# Patient Record
Sex: Male | Born: 1970 | Race: Black or African American | Hispanic: No | Marital: Single | Smoking: Current every day smoker
Health system: Southern US, Community
[De-identification: ages and names within clinical notes are randomized; demographics above are authoritative.]

## PROBLEM LIST (undated history)

## (undated) DIAGNOSIS — K635 Polyp of colon: Secondary | ICD-10-CM

## (undated) DIAGNOSIS — F4310 Post-traumatic stress disorder, unspecified: Secondary | ICD-10-CM

## (undated) HISTORY — PX: OTHER SURGICAL HISTORY: SHX169

---

## 2012-07-29 ENCOUNTER — Emergency Department (HOSPITAL_COMMUNITY)
Admission: EM | Admit: 2012-07-29 | Discharge: 2012-07-29 | Disposition: A | Discharge status: Home / Self Care | Payer: Self-pay | Attending: Emergency Medicine | Admitting: Emergency Medicine

## 2012-07-29 ENCOUNTER — Encounter (HOSPITAL_COMMUNITY): Payer: Self-pay | Admitting: Emergency Medicine

## 2012-07-29 ENCOUNTER — Emergency Department (HOSPITAL_COMMUNITY): Payer: Self-pay

## 2012-07-29 DIAGNOSIS — R609 Edema, unspecified: Secondary | ICD-10-CM | POA: Insufficient documentation

## 2012-07-29 DIAGNOSIS — Z860100 Personal history of colon polyps, unspecified: Secondary | ICD-10-CM | POA: Insufficient documentation

## 2012-07-29 DIAGNOSIS — R0602 Shortness of breath: Secondary | ICD-10-CM | POA: Insufficient documentation

## 2012-07-29 DIAGNOSIS — R42 Dizziness and giddiness: Secondary | ICD-10-CM | POA: Insufficient documentation

## 2012-07-29 DIAGNOSIS — R079 Chest pain, unspecified: Secondary | ICD-10-CM | POA: Insufficient documentation

## 2012-07-29 DIAGNOSIS — Z79899 Other long term (current) drug therapy: Secondary | ICD-10-CM | POA: Insufficient documentation

## 2012-07-29 DIAGNOSIS — R600 Localized edema: Secondary | ICD-10-CM

## 2012-07-29 DIAGNOSIS — F4310 Post-traumatic stress disorder, unspecified: Secondary | ICD-10-CM

## 2012-07-29 DIAGNOSIS — F411 Generalized anxiety disorder: Secondary | ICD-10-CM | POA: Insufficient documentation

## 2012-07-29 DIAGNOSIS — R269 Unspecified abnormalities of gait and mobility: Secondary | ICD-10-CM | POA: Insufficient documentation

## 2012-07-29 HISTORY — DX: Post-traumatic stress disorder, unspecified: F43.10

## 2012-07-29 HISTORY — DX: Polyp of colon: K63.5

## 2012-07-29 LAB — COMPREHENSIVE METABOLIC PANEL
ALT: 23 U/L (ref 0–53)
Alkaline Phosphatase: 59 U/L (ref 39–117)
BUN: 11 mg/dL (ref 6–23)
CO2: 25 mEq/L (ref 19–32)
GFR calc Af Amer: >90 mL/min (ref >90)
GFR calc non Af Amer: >90 mL/min (ref >90)
Glucose, Bld: 97 mg/dL (ref 70–99)
Potassium: 3.9 mEq/L (ref 3.5–5.1)
Sodium: 134 mEq/L — ABNORMAL LOW (ref 135–145)

## 2012-07-29 LAB — CBC WITH DIFFERENTIAL/PLATELET
Eosinophils Relative: 2 % (ref 0–5)
Hemoglobin: 14.3 g/dL (ref 13.0–17.0)
Lymphocytes Relative: 31 % (ref 12–46)
Lymphs Abs: 2.4 10*3/uL (ref 0.7–4.0)
MCV: 84.6 fL (ref 78.0–100.0)
Monocytes Relative: 9 % (ref 3–12)
Platelets: 239 10*3/uL (ref 150–400)
RBC: 5.26 MIL/uL (ref 4.22–5.81)
WBC: 7.6 10*3/uL (ref 4.0–10.5)

## 2012-07-29 MED ORDER — MORPHINE SULFATE 4 MG/ML IJ SOLN
5.0000 mg | Freq: Once | INTRAMUSCULAR | Status: AC
  Administered 2012-07-29: 5 mg via INTRAMUSCULAR
  Filled 2012-07-29: qty 2

## 2012-07-29 MED ORDER — MORPHINE SULFATE 10 MG/ML IJ SOLN: 10.0000 mg | Freq: Once | INTRAMUSCULAR | Status: DC

## 2012-07-29 NOTE — ED Notes (Signed)
Pt states that for months he has been dealing with peripheral edema in his lower legs and feet as well as his hands. Pt states "i cant even get my ring of my finger".
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
## 2012-07-29 NOTE — ED Notes (Signed)
Pt requesting more pain meds. Thom Chimes PA aware.

## 2012-07-29 NOTE — ED Notes (Signed)
Patient transported to X-ray 

## 2012-07-30 NOTE — ED Provider Notes (Signed)
History/physical exam/procedure(s) were performed by non-physician practitioner and as supervising physician I was immediately available for consultation/collaboration. I have reviewed all notes and am in agreement with care and plan.   Tonjua Rossetti S Cashmere Dingley, MD 07/30/12 1203 

## 2015-01-27 IMAGING — CR DG CHEST 2V
2 series · 2 of 2 positions shown · non-contrast
Comparison: None.

CLINICAL DATA: Chest pain.  Shortness of breath.

CHEST - 2 VIEW

[w chest lat]
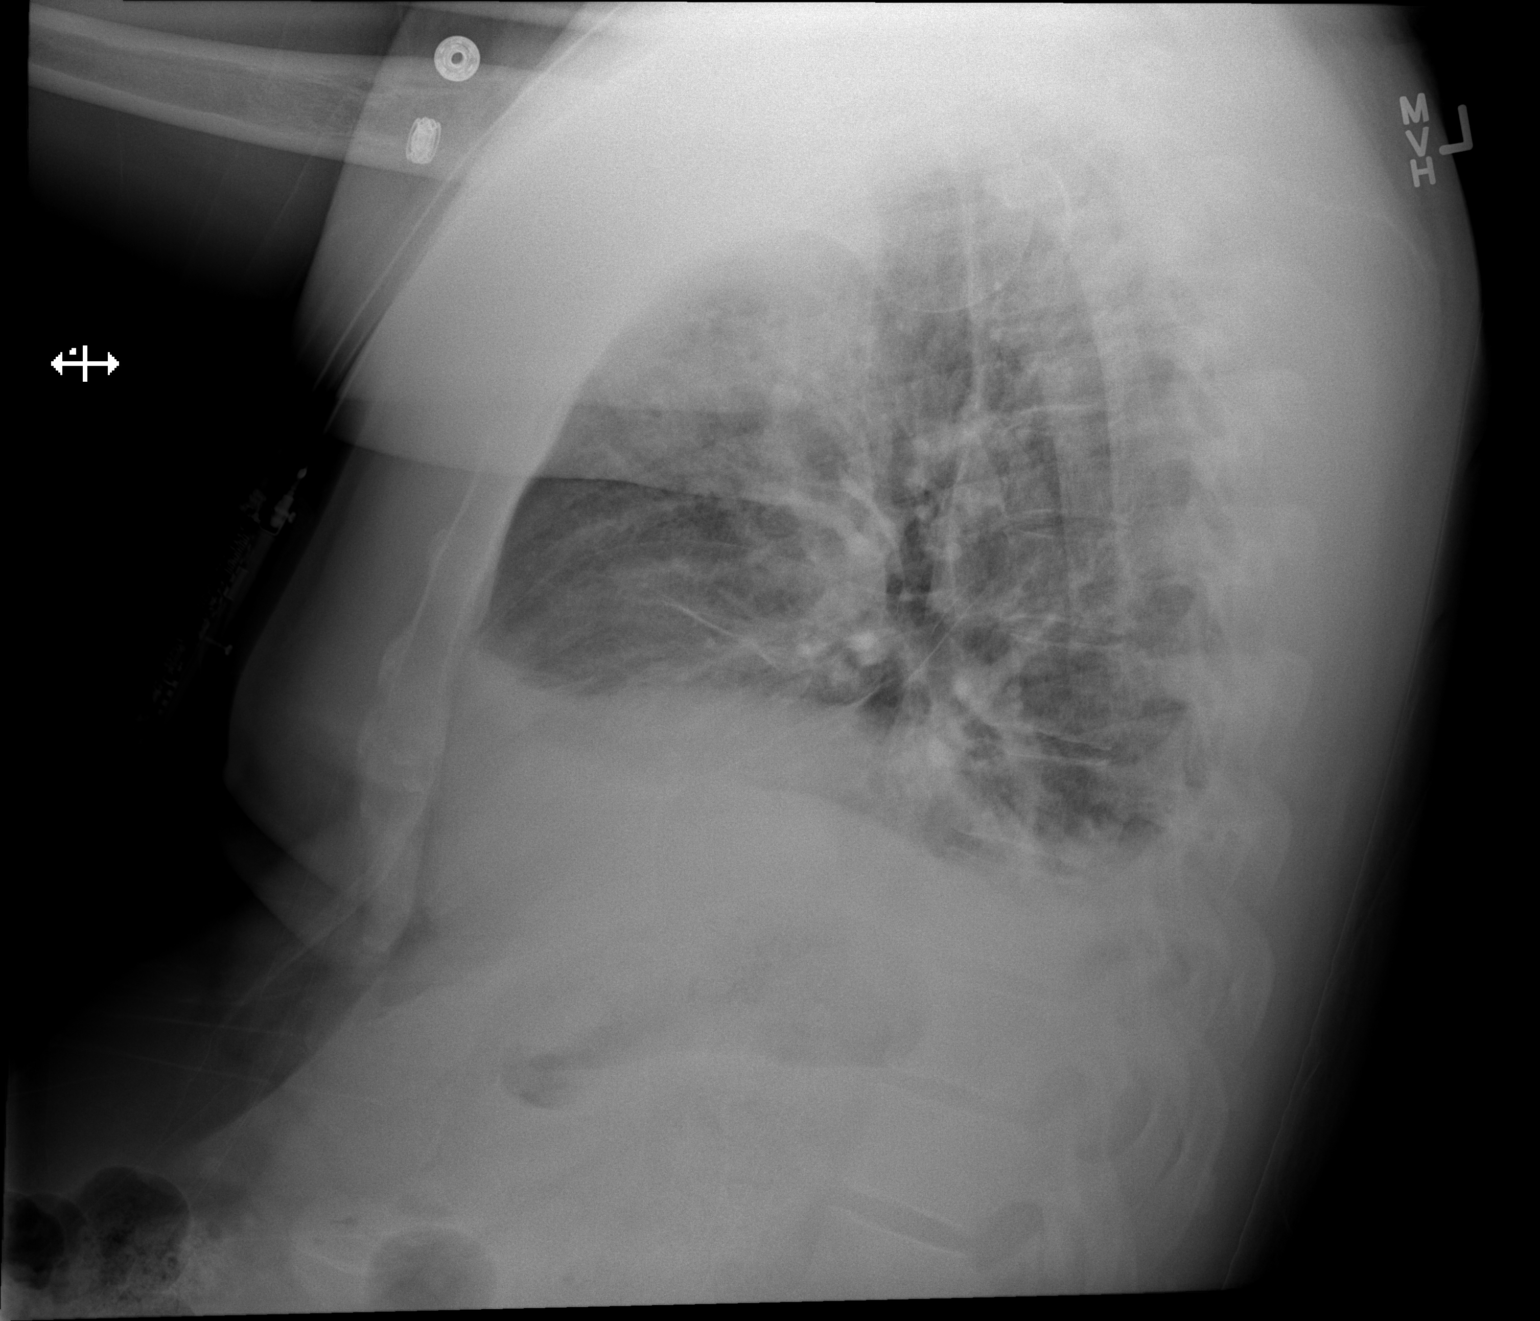

[x chest ap]
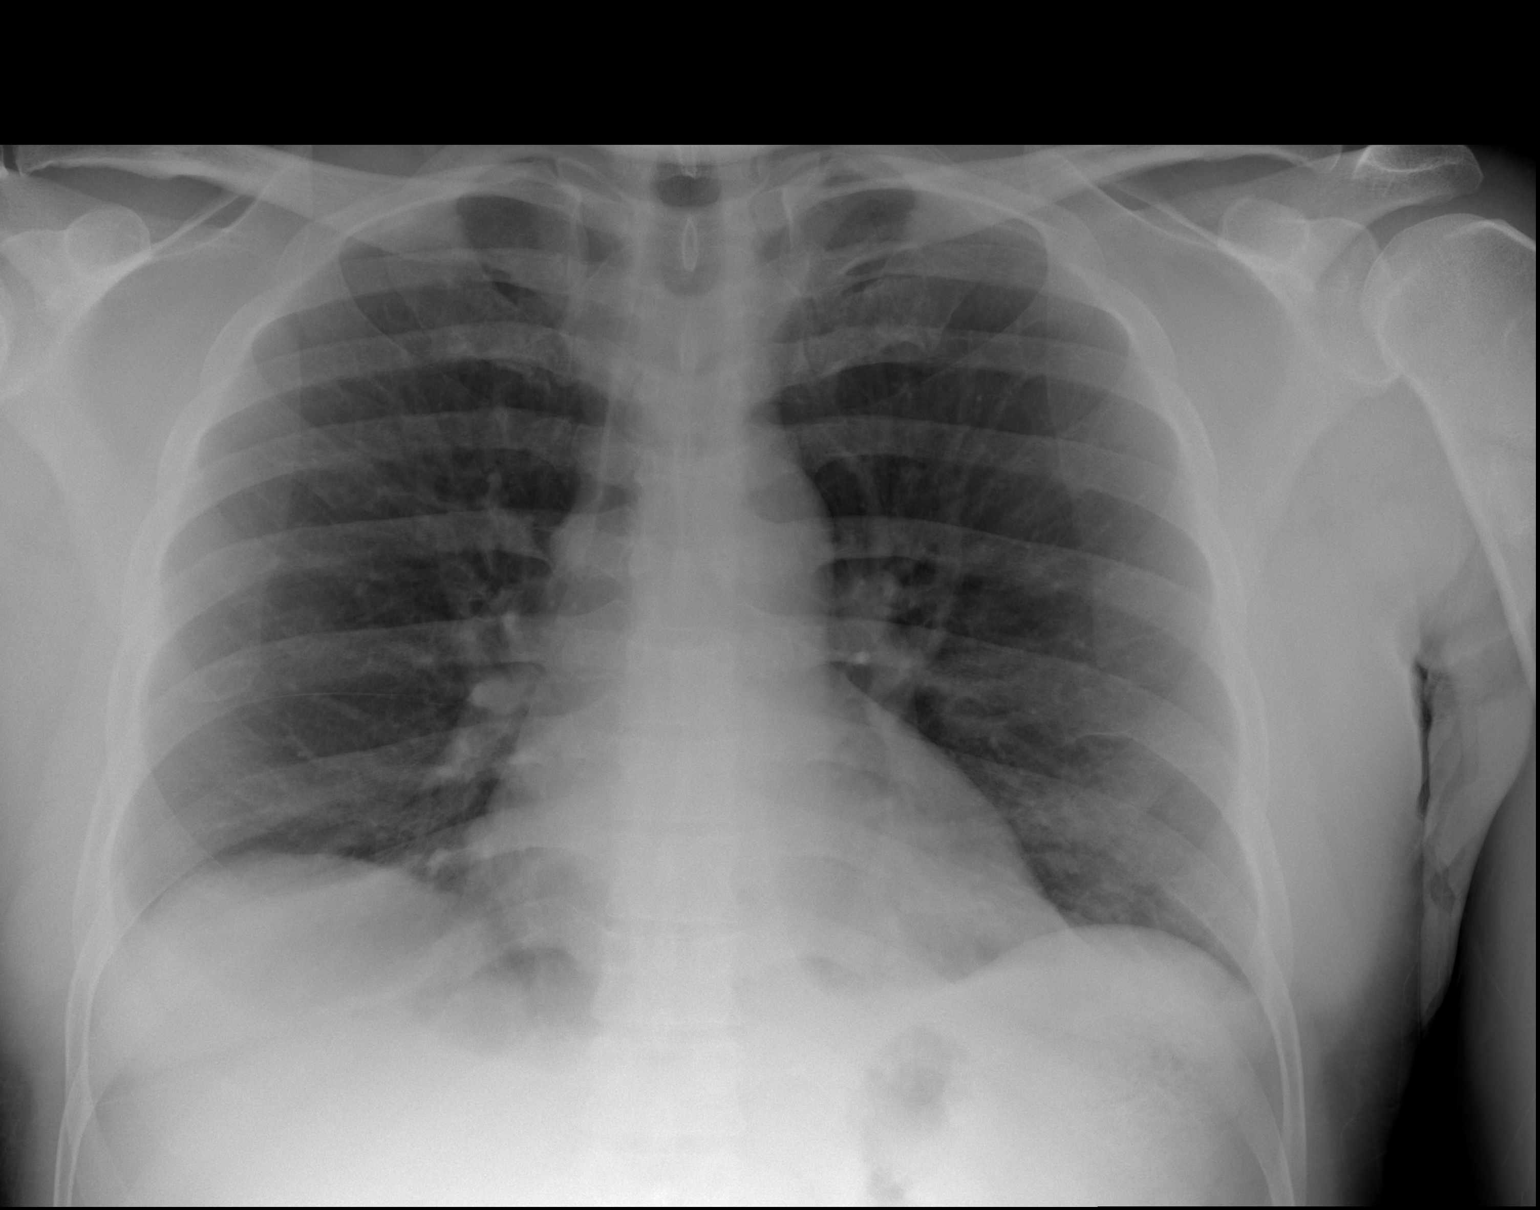

[2 of 2 positions shown; findings below may reference images not displayed]

FINDINGS: The heart, mediastinum and hilar contours are normal.
The lungs are clear except for some mild atelectatic changes at the
bases seen best on the lateral view.  There are no effusions or
pneumothoraces.  There are no acute bony abnormalities.
IMPRESSION: No active disease.

## 2017-10-08 ENCOUNTER — Emergency Department (HOSPITAL_COMMUNITY): Payer: Non-veteran care

## 2017-10-08 ENCOUNTER — Other Ambulatory Visit: Payer: Self-pay

## 2017-10-08 ENCOUNTER — Encounter (HOSPITAL_COMMUNITY): Payer: Self-pay | Admitting: Emergency Medicine

## 2017-10-08 ENCOUNTER — Emergency Department (HOSPITAL_COMMUNITY)
Admission: EM | Admit: 2017-10-08 | Discharge: 2017-10-09 | Disposition: A | Discharge status: Home / Self Care | Payer: Non-veteran care | Attending: Emergency Medicine | Admitting: Emergency Medicine

## 2017-10-08 DIAGNOSIS — F17210 Nicotine dependence, cigarettes, uncomplicated: Secondary | ICD-10-CM | POA: Diagnosis not present

## 2017-10-08 DIAGNOSIS — R0602 Shortness of breath: Secondary | ICD-10-CM | POA: Diagnosis present

## 2017-10-08 DIAGNOSIS — F4310 Post-traumatic stress disorder, unspecified: Secondary | ICD-10-CM | POA: Diagnosis not present

## 2017-10-08 DIAGNOSIS — G4700 Insomnia, unspecified: Secondary | ICD-10-CM | POA: Insufficient documentation

## 2017-10-08 DIAGNOSIS — E876 Hypokalemia: Secondary | ICD-10-CM | POA: Diagnosis not present

## 2017-10-08 DIAGNOSIS — Z79899 Other long term (current) drug therapy: Secondary | ICD-10-CM | POA: Diagnosis not present

## 2017-10-08 NOTE — ED Triage Notes (Addendum)
Pt reports "difficulty breathing and feeling like he's going to die." Also endorses CP, back pain, weakness, insomnia for 2 days. States he feels like he's going to stop breathing any second. Appears in NAD. No tachypnea or accessory muscle usage. VSS and WNL. Flat affect for varied complaints spoken of.

## 2017-10-09 LAB — URINALYSIS, ROUTINE W REFLEX MICROSCOPIC
BACTERIA UA: NONE SEEN
Bilirubin Urine: NEGATIVE
Glucose, UA: NEGATIVE mg/dL
KETONES UR: 5 mg/dL — AB
Leukocytes, UA: NEGATIVE
Nitrite: NEGATIVE
Protein, ur: NEGATIVE mg/dL
Specific Gravity, Urine: 1.015 (ref 1.005–1.030)
pH: 6 (ref 5.0–8.0)

## 2017-10-09 LAB — RAPID URINE DRUG SCREEN, HOSP PERFORMED
Amphetamines: POSITIVE — AB
BENZODIAZEPINES: NOT DETECTED
Barbiturates: NOT DETECTED
COCAINE: NOT DETECTED
OPIATES: NOT DETECTED
Tetrahydrocannabinol: NOT DETECTED

## 2017-10-09 LAB — BASIC METABOLIC PANEL
ANION GAP: 13 (ref 5–15)
BUN: 6 mg/dL (ref 6–20)
CHLORIDE: 96 mmol/L — AB (ref 98–111)
CO2: 29 mmol/L (ref 22–32)
Calcium: 9.8 mg/dL (ref 8.9–10.3)
Creatinine, Ser: 1.09 mg/dL (ref 0.61–1.24)
GFR calc Af Amer: >60 mL/min (ref >60)
GFR calc non Af Amer: >60 mL/min (ref >60)
GLUCOSE: 112 mg/dL — AB (ref 70–99)
POTASSIUM: 2.8 mmol/L — AB (ref 3.5–5.1)
Sodium: 138 mmol/L (ref 135–145)

## 2017-10-09 LAB — CBC
HEMATOCRIT: 55 % — AB (ref 39.0–52.0)
HEMOGLOBIN: 17.5 g/dL — AB (ref 13.0–17.0)
MCH: 28.2 pg (ref 26.0–34.0)
MCHC: 31.8 g/dL (ref 30.0–36.0)
MCV: 88.7 fL (ref 78.0–100.0)
Platelets: 288 10*3/uL (ref 150–400)
RBC: 6.2 MIL/uL — ABNORMAL HIGH (ref 4.22–5.81)
RDW: 11.7 % (ref 11.5–15.5)
WBC: 9.5 10*3/uL (ref 4.0–10.5)

## 2017-10-09 LAB — I-STAT TROPONIN, ED: Troponin i, poc: 0 ng/mL (ref 0.00–0.08)

## 2017-10-09 LAB — ETHANOL: Alcohol, Ethyl (B): <10 mg/dL (ref <10)

## 2017-10-09 LAB — MAGNESIUM: Magnesium: 1.9 mg/dL (ref 1.7–2.4)

## 2017-10-09 MED ORDER — LORAZEPAM 1 MG PO TABS
1.0000 mg | ORAL_TABLET | Freq: Once | ORAL | Status: AC
  Administered 2017-10-09: 1 mg via ORAL
  Filled 2017-10-09: qty 1

## 2017-10-09 MED ORDER — POTASSIUM CHLORIDE CRYS ER 20 MEQ PO TBCR: 20.0000 meq | EXTENDED_RELEASE_TABLET | Freq: Every day | ORAL | 0 refills | Status: AC

## 2017-10-09 MED ORDER — POTASSIUM CHLORIDE 10 MEQ/100ML IV SOLN
10.0000 meq | INTRAVENOUS | Status: AC
  Administered 2017-10-09 (×2): 10 meq via INTRAVENOUS
  Filled 2017-10-09 (×2): qty 100

## 2017-10-09 MED ORDER — SODIUM CHLORIDE 0.9 % IV BOLUS
1000.0000 mL | Freq: Once | INTRAVENOUS | Status: AC
  Administered 2017-10-09: 1000 mL via INTRAVENOUS

## 2017-10-09 MED ORDER — MAGNESIUM SULFATE 2 GM/50ML IV SOLN
2.0000 g | Freq: Once | INTRAVENOUS | Status: AC
  Administered 2017-10-09: 2 g via INTRAVENOUS
  Filled 2017-10-09: qty 50

## 2017-10-09 MED ORDER — POTASSIUM CHLORIDE CRYS ER 20 MEQ PO TBCR
40.0000 meq | EXTENDED_RELEASE_TABLET | Freq: Once | ORAL | Status: AC
  Administered 2017-10-09: 40 meq via ORAL
  Filled 2017-10-09: qty 2
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
## 2017-10-09 NOTE — ED Notes (Signed)
Patient ambulated to room with steady gait.

## 2017-10-09 NOTE — ED Notes (Signed)
Go to start patient's second run of potassium and patient states "I need to go home. I've already had one bag of that".   PA made aware.

## 2017-10-09 NOTE — ED Provider Notes (Signed)
[facility] EMERGENCY DEPARTMENT Provider Note   CSN: [id number] Arrival date & time: 10/08/17  2325     History   Chief Complaint Chief Complaint  Patient presents with  . Shortness of Breath  . Insomnia    HPI Micheal Barber is a 47 y.o. male with a hx of PTSD, COPD, sleep apnea presents to the Emergency Department complaining of gradual, persistent, progressively worsening SOB and dizziness onset this morning. Associated symptoms include inability to sleep (no sleep in 2 days), chest pain.  Pt reports when he closes his eyes, "my eyeballs bounce around like pinballs."  Pt reports he has never felt like this before.  Pt reports he feels like he is hyperventilating and he might die.  Pt reports he has no appetite and therefore not eaten in 2 days.  Pt reports he has been compliant with his antidepressants .  Nothing makes his symptoms better.  Pt reports symptoms are worsening with time.  Pt denies headache, neck pain, fever, chills, abd pain, N/V/D, weakness, syncope, dysuria.  Pt reports "my brain is on fire" and "I feel like I am going to become a zombie."    Pt reports smoking 1 ppd of cigarettes.  Pt denies marijuana or other drugs, vaping or EtOH usage.  Pt denies SI/HI, AVH.  Pt reports he has been drinking sweet tea and water today with polyuria and polydipsia.  No hx of DM.     The history is provided by the patient and medical records. No language interpreter was used.    Past Medical History:  Diagnosis Date  . Colon polyps   . PTSD (post-traumatic stress disorder)     There are no active problems to display for this patient.   Past Surgical History:  Procedure Laterality Date  . colon polyp removed          Home Medications    Prior to Admission medications   Medication Sig Start Date End Date Taking? Authorizing Provider  benztropine (COGENTIN) 1 MG tablet Take 0.5 mg by mouth 2 (two) times daily.    [provider]  clonazePAM  (KLONOPIN) 0.5 MG tablet Take 0.5 mg by mouth 2 (two) times daily.    [provider]  FLUoxetine (PROZAC) 20 MG capsule Take 20 mg by mouth every morning.    [provider]  hydrOXYzine (ATARAX/VISTARIL) 25 MG tablet Take 25 mg by mouth 2 (two) times daily as needed for anxiety.    [provider]  hyoscyamine (LEVSIN SL) 0.125 MG SL tablet Place 0.125 mg under the tongue every 4 (four) hours as needed for cramping.    [provider]  OLANZapine (ZYPREXA) 20 MG tablet Take 10-20 mg by mouth 2 (two) times daily. Take 0.5 tablet every morning and 1 tablet at bedtime.    [provider]  oxyCODONE (OXYCONTIN) 80 MG 12 hr tablet Take 80 mg by mouth every 12 (twelve) hours.    [provider]  potassium chloride SA (K-DUR,KLOR-CON) 20 MEQ tablet Take 1 tablet (20 mEq total) by mouth daily. 10/09/17   Deshawna Mcneece, Dahlia Client, PA-C  prazosin (MINIPRESS) 2 MG capsule Take 4 mg by mouth at bedtime.    [provider]  propranolol (INDERAL) 40 MG tablet Take 40 mg by mouth 2 (two) times daily.    [provider]  SUMAtriptan (IMITREX) 6 MG/0.5ML SOLN injection Inject 6 mg into the skin every 2 (two) hours as needed for migraine or headache.  [provider]    Family History History reviewed. No pertinent family history.  Social History Social History   Tobacco Use  . Smoking status: Current Every Day Smoker    Packs/day: 0.50    Years: 5.00    Pack years: 2.50    Types: Cigarettes  . Smokeless tobacco: Never Used  Substance Use Topics  . Alcohol use: No  . Drug use: No     Allergies   Tramadol and Trazodone and nefazodone   Review of Systems Review of Systems  Constitutional: Positive for appetite change. Negative for diaphoresis, fatigue, fever and unexpected weight change.  HENT: Negative for congestion and mouth sores.   Eyes: Negative for visual disturbance.  Respiratory: Positive for shortness of  breath. Negative for cough, chest tightness and wheezing.   Cardiovascular: Positive for chest pain.  Gastrointestinal: Negative for abdominal pain, constipation, diarrhea, nausea and vomiting.  Endocrine: Positive for polydipsia and polyuria. Negative for polyphagia.  Genitourinary: Negative for dysuria, frequency, hematuria and urgency.  Musculoskeletal: Negative for back pain and neck stiffness.  Skin: Negative for rash.  Allergic/Immunologic: Negative for immunocompromised state.  Neurological: Negative for syncope, light-headedness and headaches.  Hematological: Does not bruise/bleed easily.  Psychiatric/Behavioral: Positive for decreased concentration and sleep disturbance. The patient is nervous/anxious and has insomnia.      Physical Exam Updated Vital Signs BP 133/90 (BP Location: Right Arm)   Pulse 63   Temp 98.9 F (37.2 C) (Oral)   Resp 16   Ht 5\' 10"  (1.778 m)   Wt 99.8 kg   SpO2 97%   BMI 31.57 kg/m   Physical Exam  Constitutional: He appears well-developed and well-nourished. No distress.  Awake, alert, nontoxic appearance  HENT:  Head: Normocephalic and atraumatic.  Mouth/Throat: Oropharynx is clear and moist. No oropharyngeal exudate.  Eyes: Conjunctivae are normal. No scleral icterus.  Neck: Normal range of motion. Neck supple.  Cardiovascular: Normal rate, regular rhythm and intact distal pulses.  Pulmonary/Chest: Effort normal and breath sounds normal. No respiratory distress. He has no wheezes.  Equal chest expansion  Abdominal: Soft. Bowel sounds are normal. He exhibits no mass. There is no tenderness. There is no rebound and no guarding.  Musculoskeletal: Normal range of motion. He exhibits no edema.  Neurological: He is alert.  Speech is clear and goal oriented Moves extremities without ataxia  Skin: Skin is warm and dry. He is not diaphoretic.  Psychiatric: His mood appears anxious. His speech is rapid and/or pressured. He is not actively  hallucinating. He expresses no homicidal and no suicidal ideation. He expresses no suicidal plans and no homicidal plans.  Pt with wide eyes, difficulty focusing, rapid speech, very anxious  Nursing note and vitals reviewed.    ED Treatments / Results  Labs (all labs ordered are listed, but only abnormal results are displayed) Labs Reviewed  BASIC METABOLIC PANEL - Abnormal; Notable for the following components:      Result Value   Potassium 2.8 (*)    Chloride 96 (*)    Glucose, Bld 112 (*)    All other components within normal limits  CBC - Abnormal; Notable for the following components:   RBC 6.20 (*)    Hemoglobin 17.5 (*)    HCT 55.0 (*)    All other components within normal limits  RAPID URINE DRUG SCREEN, HOSP PERFORMED - Abnormal; Notable for the following components:   Amphetamines POSITIVE (*)    All other components within normal limits  URINALYSIS,  ROUTINE W REFLEX MICROSCOPIC - Abnormal; Notable for the following components:   Hgb urine dipstick SMALL (*)    Ketones, ur 5 (*)    All other components within normal limits  MAGNESIUM  ETHANOL  I-STAT TROPONIN, ED     Radiology Dg Chest 2 View  Result Date: 10/09/2017 CLINICAL DATA:  Chest pain and shortness of breath today. EXAM: CHEST - 2 VIEW COMPARISON:  07/29/2012 FINDINGS: Lung volumes are low. The cardiomediastinal contours are normal. The lungs are clear. Pulmonary vasculature is normal. No consolidation, pleural effusion, or pneumothorax. No acute osseous abnormalities are seen. IMPRESSION: Low lung volumes without acute chest finding. Electronically Signed   By: Narda Rutherford M.D.   On: 10/09/2017 00:12    Procedures Procedures (including critical care time)  Medications Ordered in ED Medications  LORazepam (ATIVAN) tablet 1 mg (has no administration in time range)  sodium chloride 0.9 % bolus 1,000 mL (0 mLs Intravenous Stopped 10/09/17 0403)  potassium chloride SA (K-DUR,KLOR-CON) CR tablet 40 mEq  (40 mEq Oral Given 10/09/17 0300)  potassium chloride 10 mEq in 100 mL IVPB (10 mEq Intravenous New Bag/Given 10/09/17 0523)  magnesium sulfate IVPB 2 g 50 mL (0 g Intravenous Stopped 10/09/17 0403)  sodium chloride 0.9 % bolus 1,000 mL (1,000 mLs Intravenous New Bag/Given 10/09/17 0521)     Initial Impression / Assessment and Plan / ED Course  I have reviewed the triage vital signs and the nursing notes.  Pertinent labs & imaging results that were available during my care of the patient were reviewed by me and considered in my medical decision making (see chart for details).  Clinical Course as of Oct 09 621  [facility] Oct 09, 2017  0443 Pt alert and oriented at this time.  He is no longer making strange statements and reports he is feeling better.  He has agreed to a Psyc assessment and potential medication recommendations.     [HM]  [phone] Discussed situation with patient's sister.  She reports she has never heard him talk like that or act this way.  She reports he looks much better at this time.  Long discussion with patient and sister.  I have recommended eval by TTS for medication recommendations.  They are both in agreement with him staying.   [HM]  0447 Pt is alert and oriented.  Denies SI/HI/AVH.  He is not currently a danger to himself or others.  Pt will stay voluntarily.  I do not believe pt requires IVC at this time.    [HM]  0615 Pt evaluated by Ala Dach of TTS.  Reports he does not meet inpatient criteria.    I personally discussed the patient with [facility].  He reports he does not wish to change any medications at this time and episodes of PTSD can look like mania.   [HM]  [phone] Discussed this with patient.  He feels well, is eating and drinking and wishes for d/c home.     [HM]    Clinical Course User Index [HM] Nitara Szczerba, Boyd Kerbs    Presents with complaints of shortness of breath however on my initial exam he appears manic.  He reports he has not slept in several days and has not  eaten.  His eyes are wide and he has difficulty focusing.  He does not appear to be hallucinating.  Patient is making strange statements including dating that he feels like he might die and that he is going to become a zombie.  He  admits to a history of PTSD but denies a history of bipolar schizophrenia.  Labs are reassuring.  Hypokalemia is noted.  Patient has received p.o. and oral potassium.  Additionally he has received magnesium.  Patient clinically dehydrated and with reported decreased p.o. intake he has been given 2 L of fluid.  5:06 AM On reassessment, patient appears better.  He is much more focused and is answering questions appropriately.  He is no longer making strange statements.  Medication list shows patient is taking numerous medications including Cogentin and Zyprexa.  He reports compliance however this is unclear.  He denies SI/HI/AVH.  Do not believe the patient is danger to himself however I have requested that he be seen by [facility].  This was discussed with both the patient and his sister who are in agreement with this plan.  At this time there is no emergent medical condition which requires chin.  He would be evaluated by TTS.  6:21 AM TTS recommends d/c home with close f/u with the [facility] for discussion of medication management.  Pt is eating and drinking without difficulty. He has finished his potassium and will be d/c home with his sister.  Discussed reasons to return to the ED.  Pt states understanding and is in agreement with the plan.     Final Clinical Impressions(s) / ED Diagnoses   Final diagnoses:  Insomnia, unspecified type  PTSD (post-traumatic stress disorder)  Hypokalemia    ED Discharge Orders         Ordered    potassium chloride SA (K-DUR,KLOR-CON) 20 MEQ tablet  Daily     10/09/17 0622           Lastacia Solum, Dahlia Client, PA-C 10/09/17 6295    Nira Conn, MD 10/09/17 [phone]

## 2017-10-09 NOTE — ED Notes (Signed)
Spoke with patient placement at [facility]; Patient does not meet in patient criteria. Patient to follow up with [facility] on out patient basis.

## 2017-10-09 NOTE — Discharge Instructions (Addendum)
1. Medications: potassium, usual home medications 2. Treatment: rest, drink plenty of fluids,  3. Follow Up: Please followup with the [facility] in 1-2 days for discussion of your diagnoses and further evaluation after today's visit; if you do not have a primary care doctor use the resource guide provided to find one; Please return to the ER for new or worsening symptoms
